# Patient Record
Sex: Female | Born: 1940 | Race: Black or African American | Hispanic: No | Marital: Married | Smoking: Never smoker
Health system: Southern US, Community
[De-identification: ages and names within clinical notes are randomized; demographics above are authoritative.]

## PROBLEM LIST (undated history)

## (undated) DIAGNOSIS — I10 Essential (primary) hypertension: Secondary | ICD-10-CM

## (undated) HISTORY — PX: MITRAL VALVE SURGERY: SHX714

---

## 2011-11-18 ENCOUNTER — Emergency Department (HOSPITAL_COMMUNITY): Payer: No Typology Code available for payment source

## 2011-11-18 ENCOUNTER — Encounter (HOSPITAL_COMMUNITY): Payer: Self-pay | Admitting: *Deleted

## 2011-11-18 ENCOUNTER — Emergency Department (HOSPITAL_COMMUNITY)
Admission: EM | Admit: 2011-11-18 | Discharge: 2011-11-19 | Disposition: A | Discharge status: Home / Self Care | Payer: No Typology Code available for payment source | Attending: Emergency Medicine | Admitting: Emergency Medicine

## 2011-11-18 ENCOUNTER — Other Ambulatory Visit: Payer: Self-pay

## 2011-11-18 DIAGNOSIS — Z79899 Other long term (current) drug therapy: Secondary | ICD-10-CM | POA: Insufficient documentation

## 2011-11-18 DIAGNOSIS — R51 Headache: Secondary | ICD-10-CM | POA: Insufficient documentation

## 2011-11-18 DIAGNOSIS — R9389 Abnormal findings on diagnostic imaging of other specified body structures: Secondary | ICD-10-CM

## 2011-11-18 DIAGNOSIS — I493 Ventricular premature depolarization: Secondary | ICD-10-CM

## 2011-11-18 DIAGNOSIS — I10 Essential (primary) hypertension: Secondary | ICD-10-CM | POA: Insufficient documentation

## 2011-11-18 DIAGNOSIS — I4949 Other premature depolarization: Secondary | ICD-10-CM | POA: Insufficient documentation

## 2011-11-18 DIAGNOSIS — Z794 Long term (current) use of insulin: Secondary | ICD-10-CM | POA: Insufficient documentation

## 2011-11-18 DIAGNOSIS — R079 Chest pain, unspecified: Secondary | ICD-10-CM | POA: Insufficient documentation

## 2011-11-18 DIAGNOSIS — M542 Cervicalgia: Secondary | ICD-10-CM | POA: Insufficient documentation

## 2011-11-18 DIAGNOSIS — E119 Type 2 diabetes mellitus without complications: Secondary | ICD-10-CM | POA: Insufficient documentation

## 2011-11-18 HISTORY — DX: Essential (primary) hypertension: I10

## 2011-11-18 LAB — POCT I-STAT TROPONIN I: Troponin i, poc: 0.02 ng/mL (ref 0.00–0.08)

## 2011-11-18 LAB — PROTIME-INR: INR: 0.96 (ref 0.00–1.49)
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
## 2011-11-18 NOTE — ED Notes (Signed)
Pt continues to c/o chest pain.  Pt alert and oriented x's 3, skin warm and dry color appropriate.
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
## 2011-11-19 ENCOUNTER — Emergency Department (HOSPITAL_COMMUNITY): Payer: No Typology Code available for payment source

## 2011-11-19 ENCOUNTER — Encounter (HOSPITAL_COMMUNITY): Payer: Self-pay | Admitting: Radiology

## 2011-11-19 LAB — CBC
HCT: 38 % (ref 36.0–46.0)
Hemoglobin: 13 g/dL (ref 12.0–15.0)
MCHC: 34.2 g/dL (ref 30.0–36.0)
RDW: 14.6 % (ref 11.5–15.5)
WBC: 7.8 10*3/uL (ref 4.0–10.5)

## 2011-11-19 LAB — BASIC METABOLIC PANEL
BUN: 23 mg/dL (ref 6–23)
Chloride: 101 mEq/L (ref 96–112)
GFR calc Af Amer: 53 mL/min — ABNORMAL LOW (ref >90)
GFR calc non Af Amer: 46 mL/min — ABNORMAL LOW (ref >90)
Glucose, Bld: 190 mg/dL — ABNORMAL HIGH (ref 70–99)
Potassium: 3.8 mEq/L (ref 3.5–5.1)
Sodium: 137 mEq/L (ref 135–145)

## 2011-11-19 LAB — TROPONIN I: Troponin I: <0.3 ng/mL (ref <0.30)

## 2011-11-19 LAB — GLUCOSE, CAPILLARY

## 2011-11-19 MED ORDER — IOHEXOL 300 MG/ML  SOLN
100.0000 mL | Freq: Once | INTRAMUSCULAR | Status: AC | PRN
  Administered 2011-11-19: 100 mL via INTRAVENOUS

## 2011-11-19 MED ORDER — NAPROXEN 500 MG PO TABS: 500.0000 mg | ORAL_TABLET | Freq: Two times a day (BID) | ORAL | Status: AC
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
## 2011-11-19 NOTE — ED Notes (Signed)
Pt being discharged home with family.  Paper scrubs given to pt.

## 2011-11-19 NOTE — ED Notes (Signed)
Family at beside. Family given emotional support. 

## 2011-11-19 NOTE — ED Notes (Signed)
Pt returned from CT family at bedside.  C-collar remains in place at this time.

## 2011-11-19 NOTE — ED Notes (Signed)
Pt returned from CT. Family at bedside

## 2011-11-19 NOTE — Discharge Instructions (Signed)
Please call your family doctor in the morning to establish a followup visit within one to 2 days. Your testing in the emergency department has revealed that your heart is slightly enlarged and today it has extra beats called PVCs. Please see the attached reading regarding this problem. I have discussed your care with the cardiologist who agrees that you can go home tonight and return to the hospital should your symptoms worsen. Please followup with your family doctor regarding your CAT scan which shows an abnormal pancreatic area. It is recommended that you get an MRCP to further evaluate this.

## 2015-11-10 ENCOUNTER — Other Ambulatory Visit: Payer: Self-pay | Admitting: Podiatry

## 2015-11-10 DIAGNOSIS — I739 Peripheral vascular disease, unspecified: Secondary | ICD-10-CM

## 2015-11-20 ENCOUNTER — Ambulatory Visit
Admission: RE | Admit: 2015-11-20 | Discharge: 2015-11-20 | Disposition: A | Discharge status: Home / Self Care | Payer: Medicare Other | Source: Ambulatory Visit | Attending: Podiatry | Admitting: Podiatry

## 2015-11-20 DIAGNOSIS — I739 Peripheral vascular disease, unspecified: Secondary | ICD-10-CM

## 2015-11-20 NOTE — Consult Note (Signed)
Chief Complaint: Patient was seen in consultation today for  Chief Complaint  Patient presents with  . Advice Only    PVD   at the request of McKinney,Benjamin  Referring Physician(s): McKinney,Benjamin  History of Present Illness: Katelyn Wood is a 75 y.o. female with multiple medical problems including diabetes, hypertension, hyperlipidemia, CHF and a history of prior mitral valve surgery.  She presents today at the kind request of Dr. Ferman Hamming to evaluate for possible peripheral arterial disease. She had a noninvasive vascular lab study performed in [place] which demonstrated incompressible vessels and a suggestion of waveform abnormality perhaps due to a long segment occlusion.  Katelyn Wood denies symptoms of claudication, rest pain, tissue breakdown or ulceration. She occasionally has intermittent tingling and burning pains in her feet consistent with neuropathy. She denies significant symptoms of heavy, achy, tired legs at the end of the day. She does note that she develops some mild edema during the course of the day for which she takes fluid pills and wears knee-high compression hose. She notes that these help.     Past Medical History  Diagnosis Date  . Hypertension   . Diabetes mellitus     Past Surgical History  Procedure Laterality Date  . Mitral valve surgery      Allergies: Review of patient's allergies indicates no known allergies.  Medications: Prior to Admission medications   Medication Sig Start Date End Date Taking? Authorizing Provider  albuterol (PROVENTIL HFA;VENTOLIN HFA) 108 (90 Base) MCG/ACT inhaler Inhale into the lungs every 6 (six) hours as needed for wheezing or shortness of breath.   Yes Historical Provider, MD  allopurinol (ZYLOPRIM) 100 MG tablet Take 100 mg by mouth daily.   Yes Historical Provider, MD  amiodarone (PACERONE) 200 MG tablet Take 200 mg by mouth daily.   Yes Historical Provider, MD  aspirin 81 MG  tablet Take 81 mg by mouth daily.   Yes Historical Provider, MD  carvedilol (COREG) 12.5 MG tablet Take 12.5 mg by mouth 2 (two) times daily with a meal.   Yes Historical Provider, MD  Cholecalciferol (VITAMIN D3) 3000 units TABS Take 2,000 tablets by mouth daily.   Yes Historical Provider, MD  esomeprazole (NEXIUM) 20 MG capsule Take 20 mg by mouth daily at 12 noon.   Yes Historical Provider, MD  fluticasone (FLONASE) 50 MCG/ACT nasal spray Place 2 sprays into both nostrils daily.   Yes Historical Provider, MD  Fluticasone-Salmeterol (ADVAIR) 250-50 MCG/DOSE AEPB Inhale 1 puff into the lungs 2 (two) times daily.   Yes Historical Provider, MD  furosemide (LASIX) 40 MG tablet Take 20 mg by mouth daily. Reported on 11/20/2015   Yes Historical Provider, MD  hydrALAZINE (APRESOLINE) 25 MG tablet Take 25 mg by mouth 3 (three) times daily.   Yes Historical Provider, MD  insulin glargine (LANTUS) 100 UNIT/ML injection Inject 38 Units into the skin at bedtime.    Yes Historical Provider, MD  insulin glargine (LANTUS) 100 UNIT/ML injection Inject into the skin at bedtime.   Yes Historical Provider, MD  isosorbide dinitrate (ISORDIL) 10 MG tablet Take 10 mg by mouth 3 (three) times daily.   Yes Historical Provider, MD  loratadine (CLARITIN) 10 MG tablet Take 10 mg by mouth daily.   Yes Historical Provider, MD  montelukast (SINGULAIR) 10 MG tablet Take 10 mg by mouth at bedtime.   Yes Historical Provider, MD  sacubitril-valsartan (ENTRESTO) 49-51 MG Take 1 tablet by mouth 2 (two) times daily.  Yes Historical Provider, MD  simvastatin (ZOCOR) 5 MG tablet Take 5 mg by mouth daily.   Yes Historical Provider, MD  spironolactone (ALDACTONE) 50 MG tablet Take 50 mg by mouth daily.   Yes Historical Provider, MD  vitamin B-12 (CYANOCOBALAMIN) 1000 MCG tablet Take 1,000 mcg by mouth daily.   Yes Historical Provider, MD  chlorhexidine (PERIDEX) 0.12 % solution Use as directed 15 mLs in the mouth or throat daily.  Reported on 11/20/2015    Historical Provider, MD  colchicine 0.6 MG tablet Take 0.6 mg by mouth daily. Reported on 11/20/2015    Historical Provider, MD  glipiZIDE (GLUCOTROL) 10 MG tablet Take 10 mg by mouth 2 (two) times daily before a meal. Reported on 11/20/2015    Historical Provider, MD  losartan (COZAAR) 50 MG tablet Take 50 mg by mouth daily. Reported on 11/20/2015    Historical Provider, MD     No family history on file.  Social History   Social History  . Marital Status: Married    Spouse Name: N/A  . Number of Children: N/A  . Years of Education: N/A   Social History Main Topics  . Smoking status: Never Smoker   . Smokeless tobacco: Never Used  . Alcohol Use: No  . Drug Use: No  . Sexual Activity: Not Asked   Other Topics Concern  . None   Social History Narrative    Review of Systems: A 12 point ROS discussed and pertinent positives are indicated in the HPI above.  All other systems are negative.  Review of Systems  Vital Signs: BP 142/66 mmHg  Pulse 59  Temp(Src) 98.4 F (36.9 C) (Oral)  Resp 14  Ht 5' 10.5" (1.791 m)  Wt 216 lb (97.977 kg)  BMI 30.54 kg/m2  SpO2 97%  Physical Exam  Constitutional: She is oriented to person, place, and time. She appears well-developed and well-nourished.  HENT:  Head: Normocephalic and atraumatic.  Eyes: No scleral icterus.  Cardiovascular: Normal rate, regular rhythm and normal heart sounds.   Pulses:      Femoral pulses are 2+ on the right side, and 2+ on the left side.      Dorsalis pedis pulses are 2+ on the right side, and 2+ on the left side.  Pulmonary/Chest: Effort normal and breath sounds normal.  Abdominal: Soft. She exhibits no distension. There is no tenderness.  Feet:  Right Foot:  Skin Integrity: Negative for ulcer, blister, skin breakdown, erythema, warmth or callus.  Left Foot:  Skin Integrity: Negative for ulcer, blister, skin breakdown, erythema, warmth or callus.  Neurological: She is alert and  oriented to person, place, and time.  Skin: Skin is warm and dry.  No evidence of hyperpigmentation, varicose veins, or other changes of chronic venous insufficiency.  Psychiatric: She has a normal mood and affect. Her behavior is normal.  Nursing note and vitals reviewed.   Imaging: No results found.  Labs:  CBC: No results for input(s): WBC, HGB, HCT, PLT in the last 8760 hours.  COAGS: No results for input(s): INR, APTT in the last 8760 hours.  BMP: No results for input(s): NA, K, CL, CO2, GLUCOSE, BUN, CALCIUM, CREATININE, GFRNONAA, GFRAA in the last 8760 hours.  Invalid input(s): CMP  LIVER FUNCTION TESTS: No results for input(s): BILITOT, AST, ALT, ALKPHOS, PROT, ALBUMIN in the last 8760 hours.  TUMOR MARKERS: No results for input(s): AFPTM, CEA, CA199, CHROMGRNA in the last 8760 hours.  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 However, while her vessels are noncompressible and the waveforms mildly abnormal, she clinically has good blood flow to both feet and is asymptomatic.  She does have some issues with lower extremity swelling but this appears to be well managed by diuretics and compression hose. She has no signs or symptoms that make me suspicious of underlying venous insufficiency.  I was able to discuss all of this with her and her husband and answered their questions fully. They are relieved to know that she does not have a significant issue with her circulation at this time.  I encouraged her to continue to follow up with her excellent healthcare providers and take her prescribed medicines to treat her risk factors which include diabetes, hypertension and hypercholesterolemia.  I would be happy to see her again at any time in the future if she develops any new or worsening symptoms as both venous disease  and peripheral arterial disease can be progressive over time.  Thank you for this interesting consult.  I greatly enjoyed meeting Katelyn Wood and look forward to participating in their care.  A copy of this report was sent to the requesting provider on this date.  Electronically Signed: Malachy MoanMCCULLOUGH, HEATH 11/20/2015, 3:21 PM   I spent a total of 30 Minutes  in face to face in clinical consultation, greater than 50% of which was counseling/coordinating care for peripheral vascular disease.

## 2016-09-23 ENCOUNTER — Encounter: Payer: Self-pay | Admitting: Vascular Surgery

## 2016-09-23 ENCOUNTER — Ambulatory Visit (INDEPENDENT_AMBULATORY_CARE_PROVIDER_SITE_OTHER): Payer: Medicare Other | Admitting: Vascular Surgery

## 2016-09-23 VITALS — BP 135/83 | HR 72 | Temp 97.3°F | Resp 16 | Ht 70.5 in | Wt 194.0 lb

## 2016-09-23 DIAGNOSIS — I739 Peripheral vascular disease, unspecified: Secondary | ICD-10-CM

## 2016-09-23 NOTE — Progress Notes (Signed)
[facility] [place]  Patient name: Katelyn Wood MRN: [id number] DOB: 1941-06-13 Sex: female  REASON FOR CONSULT: Evaluation of lower extremity arterial pathology  HPI: Katelyn Wood is a 76 y.o. female, who is seen today for evaluation of lower extremity arterial pathology. She is very pleasant 76 year old who had the rash on her left calf and ankle. Also reported some swelling associated with this. This has completely resolved. She underwent noninvasive vascular lab studies in [place] and I have these for review. She had a venous study on 09/02/2016 and arterial study on 10/03/2015. He denies any history of lower from any claudication symptoms. Has no tissue loss on her feet. Reports that the rash has resolved on her leg. She does have a prior history of valve replacement in 2004 at [facility] and subsequently has undergone ablation for cardiac arrhythmia. She has no history of stroke.  Past Medical History:  Diagnosis Date  . Diabetes mellitus   . Hypertension     No family history on file.  SOCIAL HISTORY: Social History   Social History  . Marital status: Married    Spouse name: N/A  . Number of children: N/A  . Years of education: N/A   Occupational History  . Not on file.   Social History Main Topics  . Smoking status: Never Smoker  . Smokeless tobacco: Never Used  . Alcohol use No  . Drug use: No  . Sexual activity: Not on file   Other Topics Concern  . Not on file   Social History Narrative  . No narrative on file    No Known Allergies  Current Outpatient Prescriptions  Medication Sig Dispense Refill  . albuterol (PROVENTIL HFA;VENTOLIN HFA) 108 (90 Base) MCG/ACT inhaler Inhale into the lungs every 6 (six) hours as needed for wheezing or shortness of breath.    . allopurinol (ZYLOPRIM) 100 MG tablet Take 100 mg by mouth daily.    Marland Kitchen amiodarone (PACERONE) 200 MG tablet Take 200 mg by mouth  daily.    Marland Kitchen aspirin 81 MG tablet Take 81 mg by mouth daily.    . carvedilol (COREG) 12.5 MG tablet Take 12.5 mg by mouth 2 (two) times daily with a meal.    . Cholecalciferol (VITAMIN D3) 3000 units TABS Take 2,000 tablets by mouth daily.    . colchicine 0.6 MG tablet Take 0.6 mg by mouth daily. Reported on 11/20/2015    . esomeprazole (NEXIUM) 20 MG capsule Take 20 mg by mouth daily at 12 noon.    . fluticasone (FLONASE) 50 MCG/ACT nasal spray Place 2 sprays into both nostrils daily.    . Fluticasone-Salmeterol (ADVAIR) 250-50 MCG/DOSE AEPB Inhale 1 puff into the lungs 2 (two) times daily.    . furosemide (LASIX) 40 MG tablet Take 20 mg by mouth daily. Reported on 11/20/2015    . hydrALAZINE (APRESOLINE) 25 MG tablet Take 25 mg by mouth 3 (three) times daily.    . insulin glargine (LANTUS) 100 UNIT/ML injection Inject 38 Units into the skin at bedtime.     . insulin glargine (LANTUS) 100 UNIT/ML injection Inject into the skin at bedtime.    . isosorbide dinitrate (ISORDIL) 10 MG tablet Take 10 mg by mouth 3 (three) times daily.    Marland Kitchen loratadine (CLARITIN) 10 MG tablet Take 10 mg by mouth daily.    . simvastatin (ZOCOR) 5 MG tablet Take 5 mg by mouth daily.    Marland Kitchen spironolactone (ALDACTONE) 50 MG  tablet Take 50 mg by mouth daily.    . vitamin B-12 (CYANOCOBALAMIN) 1000 MCG tablet Take 1,000 mcg by mouth daily.    . chlorhexidine (PERIDEX) 0.12 % solution Use as directed 15 mLs in the mouth or throat daily. Reported on 11/20/2015    . glipiZIDE (GLUCOTROL) 10 MG tablet Take 10 mg by mouth 2 (two) times daily before a meal. Reported on 11/20/2015    . losartan (COZAAR) 50 MG tablet Take 50 mg by mouth daily. Reported on 11/20/2015    . montelukast (SINGULAIR) 10 MG tablet Take 10 mg by mouth at bedtime.    . sacubitril-valsartan (ENTRESTO) 49-51 MG Take 1 tablet by mouth 2 (two) times daily.     No current facility-administered medications for this visit.     REVIEW OF SYSTEMS:  [X]  denotes positive  finding, [ ]  denotes negative finding Cardiac  Comments:  Chest pain or chest pressure:    Shortness of breath upon exertion:    Short of breath when lying flat:    Irregular heart rhythm:        Vascular    Pain in calf, thigh, or hip brought on by ambulation:    Pain in feet at night that wakes you up from your sleep:     Blood clot in your veins:    Leg swelling:  x       Pulmonary    Oxygen at home:    Productive cough:     Wheezing:         Neurologic    Sudden weakness in arms or legs:     Sudden numbness in arms or legs:     Sudden onset of difficulty speaking or slurred speech:    Temporary loss of vision in one eye:     Problems with dizziness:         Gastrointestinal    Blood in stool:     Vomited blood:         Genitourinary    Burning when urinating:     Blood in urine:        Psychiatric    Major depression:         Hematologic    Bleeding problems:    Problems with blood clotting too easily:        Skin    Rashes or ulcers:        Constitutional    Fever or chills:      PHYSICAL EXAM: Vitals:   09/23/16 1044  BP: 135/83  Pulse: 72  Resp: 16  Temp: 97.3 F (36.3 C)  SpO2: 98%  Weight: 194 lb (88 kg)  Height: 5' 10.5" (1.791 m)    GENERAL: The patient is a well-nourished female, in no acute distress. The vital signs are documented above. CARDIOVASCULAR: Carotid arteries without bruits bilaterally. Radial pulses are 2+ bilaterally. She has 2+ dorsalis pedis pulses bilaterally. Heart is regular rate and rhythm. PULMONARY: There is good air exchange  ABDOMEN: Soft and non-tender  MUSCULOSKELETAL: There are no major deformities or cyanosis. NEUROLOGIC: No focal weakness or paresthesias are detected. SKIN: There are no ulcers or rashes noted. PSYCHIATRIC: The patient has a normal affect.  DATA:  Noninvasive studies recently of her venous system revealed no evidence of DVT. She had mild insignificant reflux at her left saphenofemoral  junction with no dilatation of her saphenous vein below this.  The arterial study from one year ago revealed noncompressible vessels but multiphasic waveforms at  her feet bilaterally.  MEDICAL ISSUES: Had long discussion with patient regarding her findings. Expanded she did have calcification of her arteries but no arterial obstruction. I did explain the lifelong need to be careful regarding her foot care but currently she has no risk for tissue loss or limb loss since she has normal arterial pulses. Not feel that she has any correctable cause for her left leg swelling which appears to be relatively mild. He was reassured this discussion and will see [place] again on an as-needed basis   Larina Earthly, MD [facility] Tel [phone] Pager [phone]

## 2016-09-24 ENCOUNTER — Encounter: Payer: Self-pay | Admitting: Podiatry

## 2019-01-08 ENCOUNTER — Other Ambulatory Visit: Payer: Self-pay

## 2019-01-08 ENCOUNTER — Emergency Department (HOSPITAL_COMMUNITY): Payer: Medicare Other

## 2019-01-08 ENCOUNTER — Emergency Department (HOSPITAL_COMMUNITY)
Admission: EM | Admit: 2019-01-08 | Discharge: 2019-01-08 | Disposition: A | Discharge status: Home / Self Care | Payer: Medicare Other | Attending: Emergency Medicine | Admitting: Emergency Medicine

## 2019-01-08 ENCOUNTER — Encounter (HOSPITAL_COMMUNITY): Payer: Self-pay | Admitting: Emergency Medicine

## 2019-01-08 DIAGNOSIS — Z79899 Other long term (current) drug therapy: Secondary | ICD-10-CM | POA: Diagnosis not present

## 2019-01-08 DIAGNOSIS — I639 Cerebral infarction, unspecified: Secondary | ICD-10-CM | POA: Diagnosis not present

## 2019-01-08 DIAGNOSIS — Z7982 Long term (current) use of aspirin: Secondary | ICD-10-CM | POA: Diagnosis not present

## 2019-01-08 DIAGNOSIS — R531 Weakness: Secondary | ICD-10-CM | POA: Diagnosis present

## 2019-01-08 DIAGNOSIS — Z794 Long term (current) use of insulin: Secondary | ICD-10-CM | POA: Diagnosis not present

## 2019-01-08 DIAGNOSIS — Z7951 Long term (current) use of inhaled steroids: Secondary | ICD-10-CM | POA: Diagnosis not present

## 2019-01-08 DIAGNOSIS — I110 Hypertensive heart disease with heart failure: Secondary | ICD-10-CM | POA: Insufficient documentation

## 2019-01-08 DIAGNOSIS — E119 Type 2 diabetes mellitus without complications: Secondary | ICD-10-CM | POA: Insufficient documentation

## 2019-01-08 DIAGNOSIS — I509 Heart failure, unspecified: Secondary | ICD-10-CM | POA: Diagnosis not present

## 2019-01-08 DIAGNOSIS — M21332 Wrist drop, left wrist: Secondary | ICD-10-CM | POA: Insufficient documentation

## 2019-01-08 DIAGNOSIS — Z1159 Encounter for screening for other viral diseases: Secondary | ICD-10-CM | POA: Diagnosis not present

## 2019-01-08 DIAGNOSIS — R200 Anesthesia of skin: Secondary | ICD-10-CM | POA: Insufficient documentation

## 2019-01-08 LAB — CBC
HCT: 37.5 % (ref 36.0–46.0)
Hemoglobin: 12.2 g/dL (ref 12.0–15.0)
MCH: 30 pg (ref 26.0–34.0)
MCHC: 32.5 g/dL (ref 30.0–36.0)
MCV: 92.4 fL (ref 80.0–100.0)
Platelets: 98 10*3/uL — ABNORMAL LOW (ref 150–400)
RBC: 4.06 MIL/uL (ref 3.87–5.11)
RDW: 13.7 % (ref 11.5–15.5)
WBC: 6.2 10*3/uL (ref 4.0–10.5)
nRBC: 0 % (ref 0.0–0.2)

## 2019-01-08 LAB — COMPREHENSIVE METABOLIC PANEL
ALT: 24 U/L (ref 0–44)
AST: 24 U/L (ref 15–41)
Albumin: 4.3 g/dL (ref 3.5–5.0)
Alkaline Phosphatase: 49 U/L (ref 38–126)
Anion gap: 12 (ref 5–15)
BUN: 20 mg/dL (ref 8–23)
CO2: 22 mmol/L (ref 22–32)
Calcium: 9.4 mg/dL (ref 8.9–10.3)
Chloride: 99 mmol/L (ref 98–111)
Creatinine, Ser: 1.44 mg/dL — ABNORMAL HIGH (ref 0.44–1.00)
GFR calc Af Amer: 40 mL/min — ABNORMAL LOW (ref >60)
GFR calc non Af Amer: 35 mL/min — ABNORMAL LOW (ref >60)
Glucose, Bld: 168 mg/dL — ABNORMAL HIGH (ref 70–99)
Potassium: 4.8 mmol/L (ref 3.5–5.1)
Sodium: 133 mmol/L — ABNORMAL LOW (ref 135–145)
Total Bilirubin: 0.8 mg/dL (ref 0.3–1.2)
Total Protein: 7.4 g/dL (ref 6.5–8.1)

## 2019-01-08 LAB — RAPID URINE DRUG SCREEN, HOSP PERFORMED
Amphetamines: NOT DETECTED
Barbiturates: NOT DETECTED
Benzodiazepines: NOT DETECTED
Cocaine: NOT DETECTED
Opiates: POSITIVE — AB
Tetrahydrocannabinol: NOT DETECTED

## 2019-01-08 LAB — URINALYSIS, ROUTINE W REFLEX MICROSCOPIC
Bilirubin Urine: NEGATIVE
Glucose, UA: NEGATIVE mg/dL
Hgb urine dipstick: NEGATIVE
Ketones, ur: NEGATIVE mg/dL
Leukocytes,Ua: NEGATIVE
Nitrite: NEGATIVE
Protein, ur: NEGATIVE mg/dL
Specific Gravity, Urine: 1.004 — ABNORMAL LOW (ref 1.005–1.030)
pH: 6 (ref 5.0–8.0)

## 2019-01-08 LAB — CBG MONITORING, ED: Glucose-Capillary: 97 mg/dL (ref 70–99)

## 2019-01-08 LAB — DIFFERENTIAL
Abs Immature Granulocytes: 0.01 10*3/uL (ref 0.00–0.07)
Basophils Absolute: 0 10*3/uL (ref 0.0–0.1)
Basophils Relative: 0 %
Eosinophils Absolute: 0 10*3/uL (ref 0.0–0.5)
Eosinophils Relative: 0 %
Immature Granulocytes: 0 %
Lymphocytes Relative: 27 %
Lymphs Abs: 1.7 10*3/uL (ref 0.7–4.0)
Monocytes Absolute: 0.5 10*3/uL (ref 0.1–1.0)
Monocytes Relative: 8 %
Neutro Abs: 4 10*3/uL (ref 1.7–7.7)
Neutrophils Relative %: 65 %

## 2019-01-08 LAB — ETHANOL: Alcohol, Ethyl (B): <10 mg/dL (ref <10)

## 2019-01-08 LAB — SARS CORONAVIRUS 2 BY RT PCR (HOSPITAL ORDER, PERFORMED IN ~~LOC~~ HOSPITAL LAB): SARS Coronavirus 2: NEGATIVE

## 2019-01-08 LAB — PROTIME-INR
INR: 1.1 (ref 0.8–1.2)
Prothrombin Time: 13.6 seconds (ref 11.4–15.2)

## 2019-01-08 LAB — TROPONIN I
Troponin I: 0.04 ng/mL (ref <0.03)
Troponin I: 0.05 ng/mL (ref <0.03)

## 2019-01-08 LAB — APTT: aPTT: 28 seconds (ref 24–36)

## 2019-01-08 MED ORDER — SODIUM CHLORIDE 0.9 % IV BOLUS
1000.0000 mL | Freq: Once | INTRAVENOUS | Status: AC
  Administered 2019-01-08: 1000 mL via INTRAVENOUS

## 2019-01-08 NOTE — ED Notes (Signed)
CRITICAL VALUE ALERT  Critical Value:  Troponin 0.04  Date & Time Notied:  01/08/19 1533  Provider Notified: Dr. Pilar Plate  Orders Received/Actions taken:

## 2019-01-08 NOTE — ED Provider Notes (Signed)
[facility] EMERGENCY DEPARTMENT Provider Note   CSN: [id number] Arrival date & time: 01/08/19  1217    History   Chief Complaint Chief Complaint  Patient presents with  . Numbness    HPI Katelyn Wood is a 78 y.o. female.     Patient lives up in the [place] area.  Patient with acute onset for the left hand weakness and left arm weakness and some numbness.  It occurred around 10:00 last evening.  EMS was called out to the house.  The patient was not brought in.  She contacted her regular doctor today and they recommended she go to the emergency department for evaluation for concerns for possible stroke.  Past medical history significant for diabetes and hypertension.  Patient without any speech problems no leg problems.  No headache.  No visual changes.  Patient states that the left hand is still weak.     Past Medical History:  Diagnosis Date  . Diabetes mellitus   . Hypertension     There are no active problems to display for this patient.   Past Surgical History:  Procedure Laterality Date  . MITRAL VALVE SURGERY       OB History    Gravida  10   Para  10   Term  10   Preterm      AB      Living  5     SAB      TAB      Ectopic      Multiple      Live Births               Home Medications    Prior to Admission medications   Medication Sig Start Date End Date Taking? Authorizing Provider  albuterol (PROVENTIL HFA;VENTOLIN HFA) 108 (90 Base) MCG/ACT inhaler Inhale into the lungs every 6 (six) hours as needed for wheezing or shortness of breath.   Yes [provider]  allopurinol (ZYLOPRIM) 100 MG tablet Take 100 mg by mouth 2 (two) times daily.    Yes [provider]  aspirin 81 MG tablet Take 81 mg by mouth daily.   Yes [provider]  candesartan (ATACAND) 8 MG tablet Take 8 mg by mouth daily.   Yes [provider]  carvedilol (COREG) 25 MG tablet Take 25 mg by mouth 2 (two) times daily with a meal.    Yes [provider]  chlorhexidine (PERIDEX) 0.12 % solution Use as directed 15 mLs in the mouth or throat daily. Reported on 11/20/2015   Yes [provider]  Cholecalciferol (VITAMIN D3) 3000 units TABS Take 2,000 tablets by mouth daily.   Yes [provider]  esomeprazole (NEXIUM) 20 MG capsule Take 20 mg by mouth daily at 12 noon.   Yes [provider]  fluticasone (FLONASE) 50 MCG/ACT nasal spray Place 2 sprays into both nostrils daily.   Yes [provider]  Fluticasone-Salmeterol (ADVAIR) 250-50 MCG/DOSE AEPB Inhale 1 puff into the lungs 2 (two) times daily.   Yes [provider]  furosemide (LASIX) 40 MG tablet Take 40 mg by mouth daily. Reported on 11/20/2015   Yes [provider]  hydrALAZINE (APRESOLINE) 25 MG tablet Take 25 mg by mouth 3 (three) times daily.   Yes [provider]  insulin glargine (LANTUS) 100 UNIT/ML injection Inject 38 Units into the skin at bedtime.    Yes [provider]  isosorbide dinitrate (ISORDIL) 20 MG tablet Take 20 mg  by mouth 2 (two) times daily.   Yes [provider]  loratadine (CLARITIN) 10 MG tablet Take 10 mg by mouth daily.   Yes [provider]  Magnesium 400 MG TABS Take 1 tablet by mouth every evening.   Yes [provider]  simvastatin (ZOCOR) 5 MG tablet Take 5 mg by mouth. Every Monday, Wednesday, Friday evening   Yes [provider]  spironolactone (ALDACTONE) 25 MG tablet Take 25 mg by mouth daily.   Yes [provider]  vitamin B-12 (CYANOCOBALAMIN) 1000 MCG tablet Take 1,000 mcg by mouth daily.   Yes [provider]    Family History History reviewed. No pertinent family history.  Social History Social History   Tobacco Use  . Smoking status: Never Smoker  . Smokeless tobacco: Never Used  Substance Use Topics  . Alcohol use: No    Alcohol/week: 0.0 standard drinks  . Drug use: No     Allergies    Patient has no known allergies.   Review of Systems Review of Systems  Constitutional: Negative for chills and fever.  HENT: Negative for rhinorrhea and sore throat.   Eyes: Negative for visual disturbance.  Respiratory: Negative for cough and shortness of breath.   Cardiovascular: Negative for chest pain and leg swelling.  Gastrointestinal: Negative for abdominal pain, diarrhea, nausea and vomiting.  Genitourinary: Negative for dysuria.  Musculoskeletal: Negative for back pain and neck pain.  Skin: Negative for rash.  Neurological: Positive for weakness and numbness. Negative for dizziness, syncope, speech difficulty, light-headedness and headaches.  Hematological: Does not bruise/bleed easily.  Psychiatric/Behavioral: Negative for confusion.     Physical Exam Updated Vital Signs BP (!) 157/90   Pulse 82   Temp 97.9 F (36.6 C) (Oral)   Resp 20   Ht 1.803 m ( )   Wt 95.7 kg   SpO2 94%   BMI 29.43 kg/m   Physical Exam Vitals signs and nursing note reviewed.  Constitutional:      General: She is not in acute distress.    Appearance: She is well-developed.  HENT:     Head: Normocephalic and atraumatic.  Eyes:     Extraocular Movements: Extraocular movements intact.     Conjunctiva/sclera: Conjunctivae normal.     Pupils: Pupils are equal, round, and reactive to light.  Neck:     Musculoskeletal: Normal range of motion and neck supple.  Cardiovascular:     Rate and Rhythm: Normal rate and regular rhythm.     Heart sounds: No murmur.  Pulmonary:     Effort: Pulmonary effort is normal. No respiratory distress.     Breath sounds: Normal breath sounds.  Abdominal:     Palpations: Abdomen is soft.     Tenderness: There is no abdominal tenderness.  Musculoskeletal:        General: No swelling.     Comments: Left hand with a wrist drop weakness to grip.  Increased weakness to extension of the hand.  Some weakness with extension at the elbow.  No obvious deformity.   Radial pulses 2+.  Cap refill is less than 2 seconds.  Skin:    General: Skin is warm and dry.     Capillary Refill: Capillary refill takes less than 2 seconds.  Neurological:     Mental Status: She is alert and oriented to person, place, and time.     Cranial Nerves: No cranial nerve deficit.     Motor: Weakness present.  Comments: Patient with left upper extremity weakness.  More so on extension.  No other focal deficits noted on exam.  Patient's grip strength is weakened.  Extension at the elbow is weakened.  Extension of the wrist is very weak.  Flexion is fairly strong.      ED Treatments / Results  Labs (all labs ordered are listed, but only abnormal results are displayed) Labs Reviewed  ETHANOL  PROTIME-INR  APTT  CBC  DIFFERENTIAL  COMPREHENSIVE METABOLIC PANEL  RAPID URINE DRUG SCREEN, HOSP PERFORMED  URINALYSIS, ROUTINE W REFLEX MICROSCOPIC  TROPONIN I    EKG EKG Interpretation  Date/Time:  Monday January 08 2019 13:37:53 EDT Ventricular Rate:  80 PR Interval:    QRS Duration: 122 QT Interval:  404 QTC Calculation: 466 R Axis:   -67 Text Interpretation:  Sinus rhythm Ventricular premature complex Nonspecific IVCD with LAD LVH with secondary repolarization abnormality Anterior infarct, old Lateral leads are also involved Probable RV involvement, suggest recording right precordial leads Confirmed by Vanetta MuldersZackowski, Tristyn Pharris [phone]([id number]) on 01/08/2019 1:54:49 PM   Radiology No results found.  Procedures Procedures (including critical care time) CRITICAL CARE Performed by: Vanetta MuldersScott Shacola Schussler Total critical care time: 30 minutes Critical care time was exclusive of separately billable procedures and treating other patients. Critical care was necessary to treat or prevent imminent or life-threatening deterioration. Critical care was time spent personally by me on the following activities: development of treatment plan with patient and/or surrogate as well as nursing, discussions  with consultants, evaluation of patient's response to treatment, examination of patient, obtaining history from patient or surrogate, ordering and performing treatments and interventions, ordering and review of laboratory studies, ordering and review of radiographic studies, pulse oximetry and re-evaluation of patient's condition.  Medications Ordered in ED Medications - No data to display   Initial Impression / Assessment and Plan / ED Course  I have reviewed the triage vital signs and the nursing notes.  Pertinent labs & imaging results that were available during my care of the patient were reviewed by me and considered in my medical decision making (see chart for details).       Patient with symptoms concerning for possible stroke.  Patient without any radiculopathy type symptoms.  No pain in the arm.  Patient will undergo stroke work-up not a candidate for TPA since this happened more than 12 hours ago.  Patient will get head CT if negative will get MRI.  Patient turned over to evening physician who will disposition patient.  If consult positive for stroke probably can be admitted here but will need to discuss with Dr. Gerilyn Pilgrimoonquah her neurologist.  If negative may be can have outpatient follow-up with neurology.   Final Clinical Impressions(s) / ED Diagnoses   Final diagnoses:  Cerebrovascular accident (CVA), unspecified mechanism [facility](HCC)    ED Discharge Orders    None       Vanetta MuldersZackowski, Mavery Milling, MD 01/08/19 1506
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
## 2019-01-08 NOTE — ED Provider Notes (Signed)
Assumed care from Dr. Deretha Emory at shift change.  10pm LKN left arm weakness--CT pending will need MRI if normal--appropriate for dc with neuro fu if MRI negative.  CT head is unremarkable, MRI ordered.  Labs reveal troponin of 0.04.  EKG reviewed, no ischemic changes.  Patient denying any chest pain now or recently.  Repeat EKG is 0.05.  Mild AKI.  Discussed case with hospitalist, admission deferred at this time given that this troponin level is largely flat, not rising, likely explained by mild dehydration, and in the setting of normal EKG and no chest pain there is no much to be done as an inpatient.  Patient given IV fluids here in the emergency department.  Discussed neurological exam with Dr. Amada Jupiter of neurology.  Given the absence of any neck pain, neck trauma, as well as the otherwise reassuring neurological exam and MRI, the most likely cause of her wrist drop is the radial nerve, which can be managed as an outpatient.  Patient is happy to hear this news and is requesting discharge.  Also spoke with daughter in law who is comfortable with the plan.  After the discussed management above, the patient was determined to be safe for discharge.  The patient was in agreement with this plan and all questions regarding their care were answered.  ED return precautions were discussed and the patient will return to the ED with any significant worsening of condition.   Sabas Sous, MD 01/08/19 1946

## 2019-01-08 NOTE — ED Triage Notes (Addendum)
Patient c/o left hand numbness and difficulty picking/grasping items. Per patient started last night after falling asleep in chair. Per patient symptoms noticed at 10-11pm. Patient reports normal at 9pm. Patient also reports itching in feet left foot at this time but states happens in both feet intermittently. Per patient seen by paramedics last night and assessed. Patient states told "she was fine. Patient states took benadryl at 11:45pm. Denies any headache, blurred vision, slurred speech, or difficulty walking.

## 2019-01-08 NOTE — Discharge Instructions (Addendum)
You were evaluated in the Emergency Department and after careful evaluation, we did not find any emergent condition requiring admission or further testing in the hospital.  Your symptoms today seem to be due to an issue with one of the nerves of the arm.  The MRI of your brain was normal.  Please follow-up with the neurology specialists for further management.  Please return to the Emergency Department if you experience any worsening of your condition.  We encourage you to follow up with a primary care provider.  Thank you for allowing [place] to be a part of your care.

## 2019-01-08 NOTE — ED Notes (Signed)
Pt in MRI.

## 2019-01-08 NOTE — ED Notes (Signed)
Pt's son is on the way to get pt. I went over discharge instructions with son.

## 2019-01-08 NOTE — ED Notes (Signed)
Updated pt's daughter in law, Daleen Snook, with current results and treatment plan.

## 2020-04-04 IMAGING — MR MRI HEAD WITHOUT CONTRAST
8 of 10 series · 35 of 48 positions shown · non-contrast
Comparison: Head CT 01/08/2019

CLINICAL DATA: Left arm weakness and numbness.

EXAM:
MRI HEAD WITHOUT CONTRAST
TECHNIQUE: Multiplanar, multiecho pulse sequences of the brain and surrounding
structures were obtained without intravenous contrast.

[Series 2: T1 · sagittal · 5.0mm · 0.41mm/px · 2 of 20 slices shown (1 of 2)]
[im 1/20]
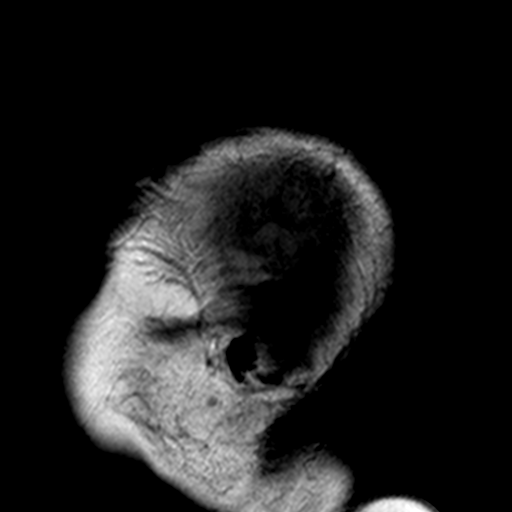
[im 20/20]
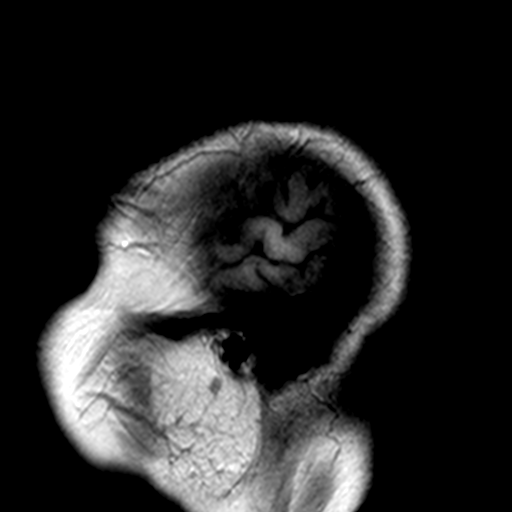

[Series 3: DWI · axial · 3.0mm · 0.74mm/px · z∈[-77,+82]mm · 7 of 55 slices shown (1 of 2)]
[im 1/55]
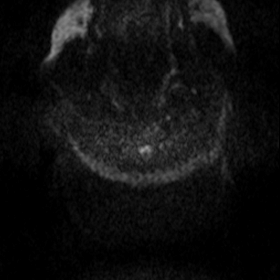
[im 10/55]
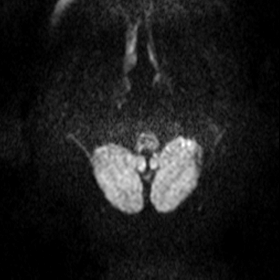
[im 19/55]
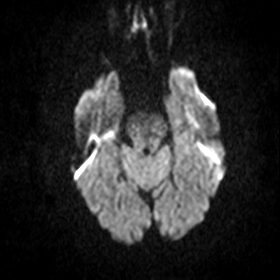
[im 28/55]
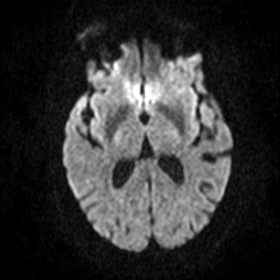
[im 37/55]
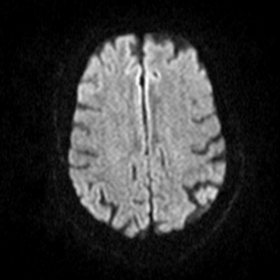
[im 46/55]
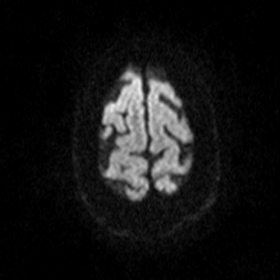
[im 55/55]
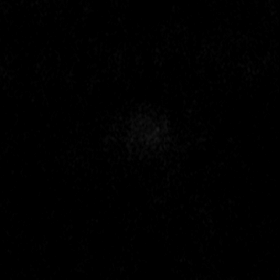

[Series 5: DWI · coronal · 5.0mm · 0.46mm/px · 4 of 34 slices shown (2 of 2)]
[im 1/34]
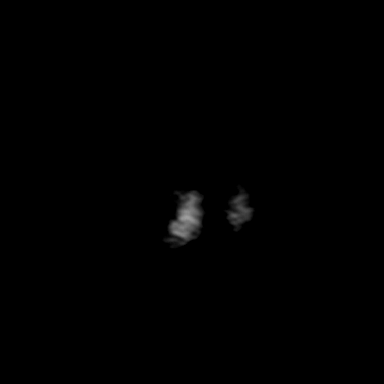
[im 12/34]
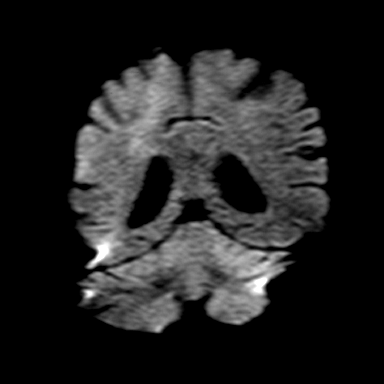
[im 23/34]
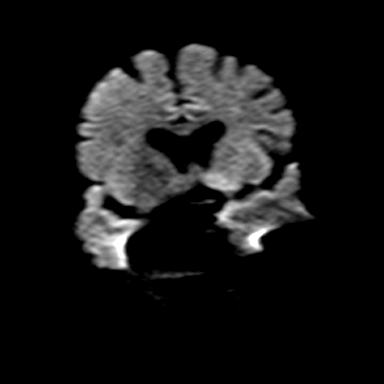
[im 34/34]
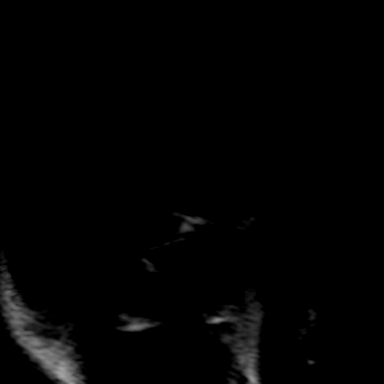

[Series 7: T2 · axial · 5.0mm · 0.70mm/px · z∈[-69,+72]mm · 3 of 23 slices shown (1 of 3)]
[im 1/23]
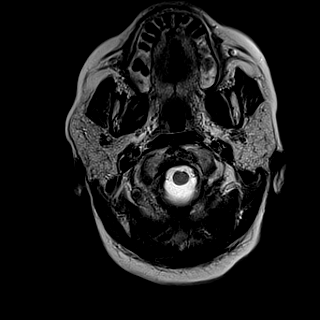
[im 12/23]
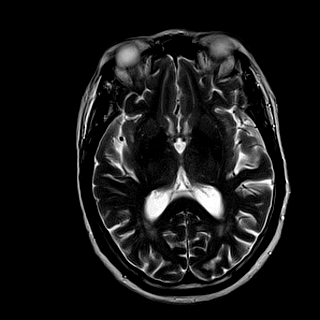
[im 23/23]
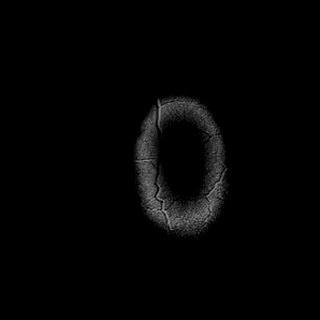

[Series 8: T2 · axial · 5.0mm · 0.42mm/px · z∈[-63,+66]mm · 3 of 21 slices shown (2 of 3)]
[im 1/21]
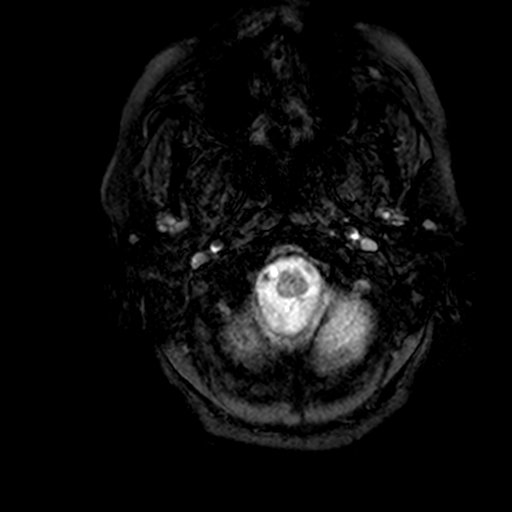
[im 11/21]
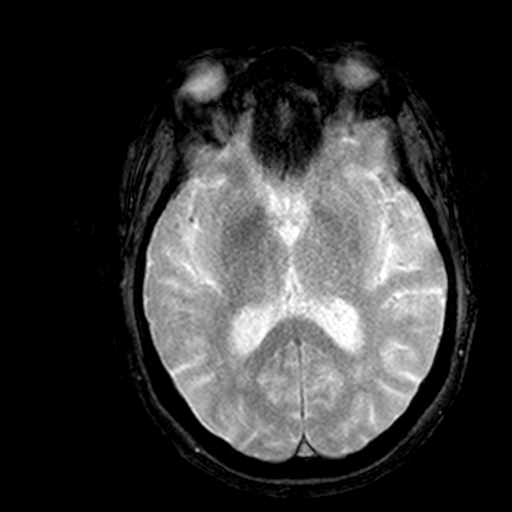
[im 21/21]
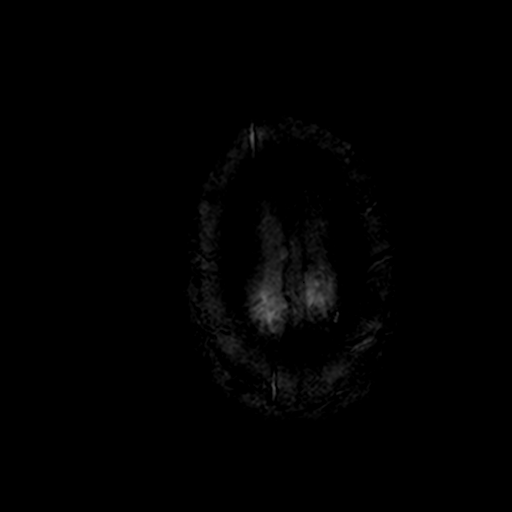

[Series 9: FLAIR · axial · 3.0mm · 0.87mm/px · z∈[-67,+69]mm · 6 of 47 slices shown]
[im 1/47]
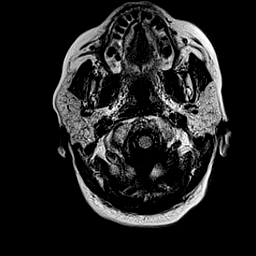
[im 10/47]
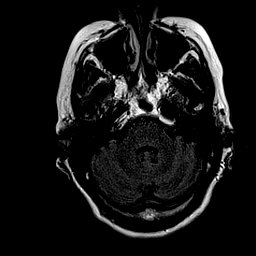
[im 19/47]
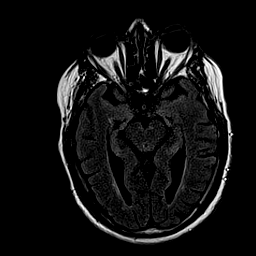
[im 28/47]
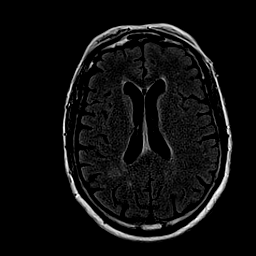
[im 37/47]
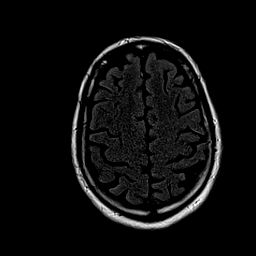
[im 47/47]
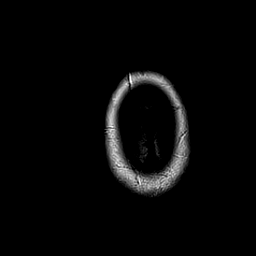

[Series 10: T1 · axial · 2.0mm · 0.44mm/px · z∈[-71,+59]mm · 7 of 77 slices shown (2 of 2)]
[im 1/77]
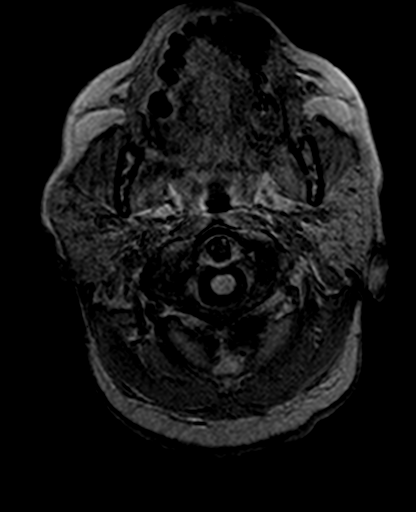
[im 10/77]
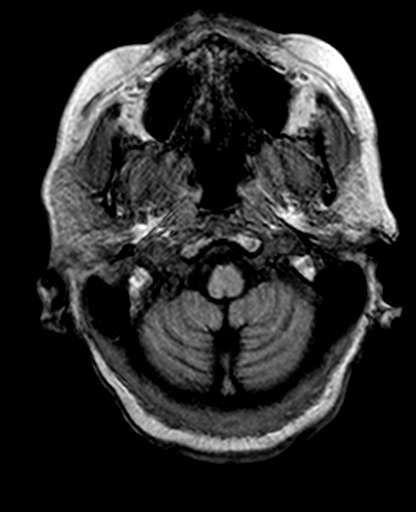
[im 20/77]
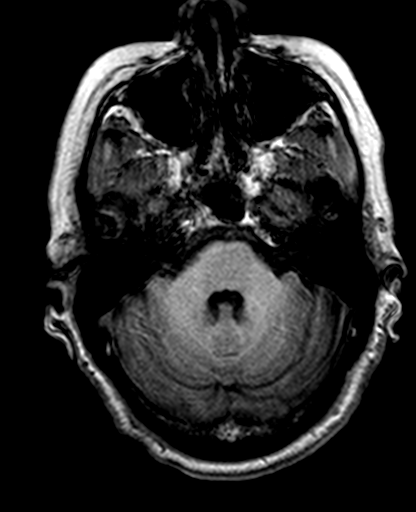
[im 29/77]
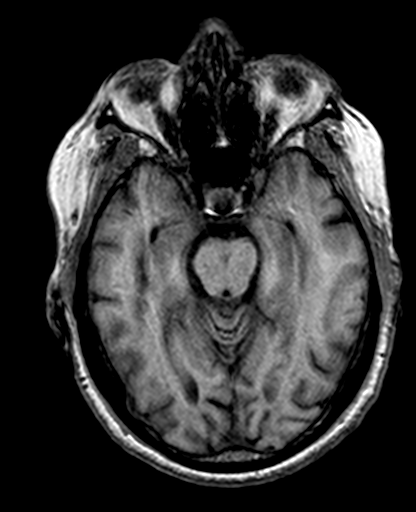
[im 48/77]
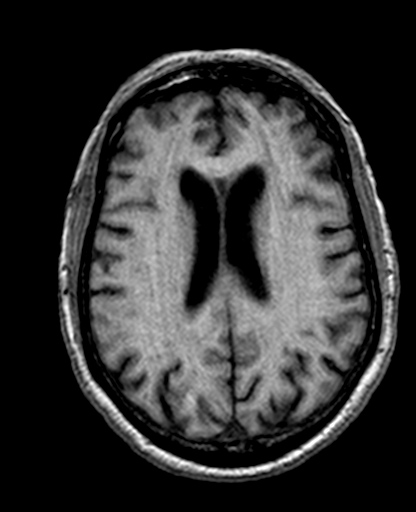
[im 58/77]
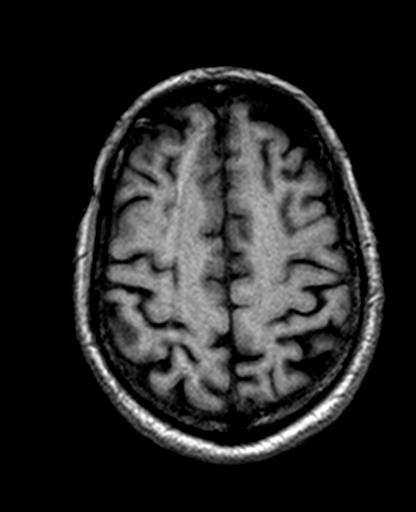
[im 67/77]
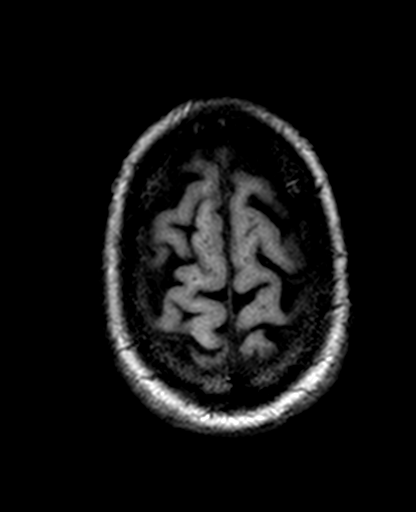

[Series 11: T2 · coronal · 5.0mm · 0.66mm/px · 3 of 28 slices shown (3 of 3)]
[im 1/28]
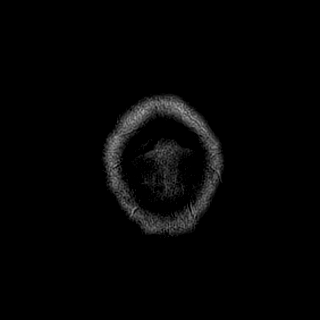
[im 14/28]
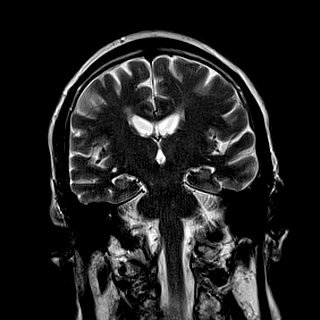
[im 28/28]
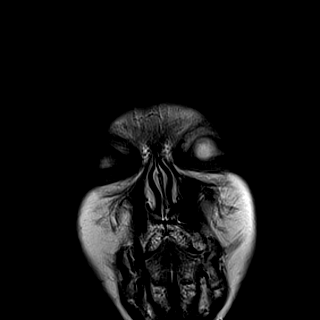

[35 of 48 positions shown; findings below may reference images not displayed]

FINDINGS: Brain: No acute infarct, mass, midline shift, or extra-axial fluid
collection is identified. There is a single chronic microhemorrhage
in the posterior left frontal lobe. Cerebral white matter T2
hyperintensities are nonspecific but compatible with mild chronic
small vessel ischemic disease. Mild cerebral atrophy is not greater
than expected for age.

Vascular: Major intracranial vascular flow voids are preserved.

Skull and upper cervical spine: No suspicious marrow lesion.

Sinuses/Orbits: Bilateral cataract extraction. Paranasal sinuses and
mastoid air cells are clear.

Other: None.
IMPRESSION: 1. No acute intracranial abnormality.
2. Mild chronic small vessel ischemic disease.

## 2021-06-09 DEATH — deceased
